# Patient Record
Sex: Female | Born: 1965 | Race: White | Hispanic: No
Health system: Southern US, Academic
[De-identification: ages and names within clinical notes are randomized; demographics above are authoritative.]

## PROBLEM LIST (undated history)

## (undated) DIAGNOSIS — I10 Essential (primary) hypertension: Secondary | ICD-10-CM

## (undated) DIAGNOSIS — E119 Type 2 diabetes mellitus without complications: Secondary | ICD-10-CM

## (undated) DIAGNOSIS — M1990 Unspecified osteoarthritis, unspecified site: Secondary | ICD-10-CM

## (undated) DIAGNOSIS — K219 Gastro-esophageal reflux disease without esophagitis: Secondary | ICD-10-CM

## (undated) DIAGNOSIS — F32A Depression, unspecified: Secondary | ICD-10-CM

## (undated) DIAGNOSIS — G4700 Insomnia, unspecified: Secondary | ICD-10-CM

## (undated) DIAGNOSIS — G629 Polyneuropathy, unspecified: Secondary | ICD-10-CM

## (undated) DIAGNOSIS — F419 Anxiety disorder, unspecified: Secondary | ICD-10-CM

## (undated) DIAGNOSIS — G2581 Restless legs syndrome: Secondary | ICD-10-CM

## (undated) DIAGNOSIS — N200 Calculus of kidney: Secondary | ICD-10-CM

## (undated) HISTORY — DX: Calculus of kidney: N20.0

## (undated) HISTORY — DX: Unspecified osteoarthritis, unspecified site: M19.90

## (undated) HISTORY — PX: HX GALL BLADDER SURGERY/CHOLE: SHX55

## (undated) HISTORY — DX: Depression, unspecified: F32.A

## (undated) HISTORY — DX: Polyneuropathy, unspecified: G62.9

## (undated) HISTORY — DX: Type 2 diabetes mellitus without complications: E11.9

## (undated) HISTORY — DX: Restless legs syndrome: G25.81

## (undated) HISTORY — DX: Insomnia, unspecified: G47.00

## (undated) HISTORY — DX: Essential (primary) hypertension: I10

## (undated) HISTORY — PX: HX TUBAL LIGATION: SHX77

## (undated) HISTORY — DX: Anxiety disorder, unspecified: F41.9

## (undated) HISTORY — DX: Gastro-esophageal reflux disease without esophagitis: K21.9

---

## 1990-07-04 ENCOUNTER — Other Ambulatory Visit (HOSPITAL_COMMUNITY): Payer: Self-pay

## 2017-04-22 IMAGING — MG 3D SCREENING MAMMO BIL W/CAD
5 series · 7 of 24 positions shown · non-contrast
Comparison: 04/21/2017.

------------- REPORT [id number] -------------
PATIENT REGISTRATION FORM

Scheduled Modality:
MG
Date Scheduled:
Referring Physician: 
PATIENT INFORMATION
 Mr.
 Mrs.
 Miss  Ms.
Email address:
[place]ECK[place], TYQUAN
Social Security:  
Age:
Sex:
F
Mailing Address:
 [address]
Home Phone
Cell Phone 
Study Type:
Diagnosis / Symptoms:
Insurance Authorization:
[employer], [employer], [place], FAXING ORDER
Notes/Special Instructions: 
INSURANCE INFORMATION
(Please give your insurance card to the receptionist.)
Name of  primary insurance
[employer]
Subscriber’s S.S. #
Group #
Insurance ID # 
Co-payment:
$
Patient’s relationship to subscriber:
 Self
 Spouse
 Child
 Other
Name of secondary insurance (if applicable):
Insurance ID #
IN CASE OF EMERGENCY
Name of friend or relative to call in case of emergency:
Relationship to patient:
Home phone #
Work phone #
The above information is true to the best of my knowledge. I authorize my insurance benefits be paid directly to the physician. I understand that I am financially responsible for any balance. I also authorize [employer] or insurance company to release any information required to process my claims.
Patient/Guardian signature
Date
------------- REPORT [id number] -------------
[facility] [place]
[address]
Daina Ms.TIGER, RAZE:
We wish to report the following on your recent mammography examination. We are sending a report to your referring physician or other health care provider. 
(       Normal/Negative:
No evidence of cancer.
This statement is mandated by the [place], Department of Health.
Your examination was performed by one of our technologists, who are registered radiological technologists and also specially certified in mammography:
___
Parlak, Edaly (M)
Dang, Mcalex (M)
Your mammogram was interpreted by our radiologist.
( 
Sofeine Made, M.D.
(Annual Breast Examination by a physician or other health care provider
(Annual Mammography Screening beginning at age 40
(Monthly Breast Self Examination
------------- REPORT [id number] -------------
AWWAD, JEMMA
HENG FAI LENE,NP
EXAM:  3D BILATERAL ANNUAL SCREENING DIGITAL MAMMOGRAM WITH TOMOSYNTHESIS AND CAD
INDICATION: Screening.

[Series 2658: R CC · right · 0.10mm/px · 2 of 2 slices shown]
[im 1/2]
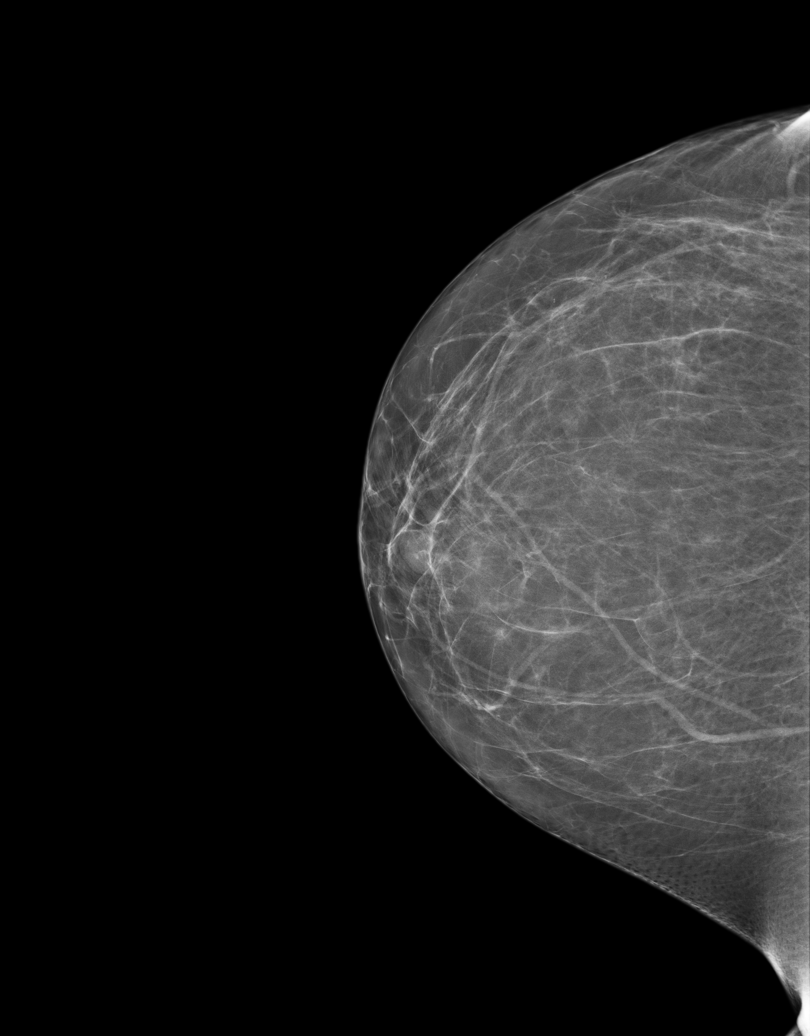
[im 2/2]
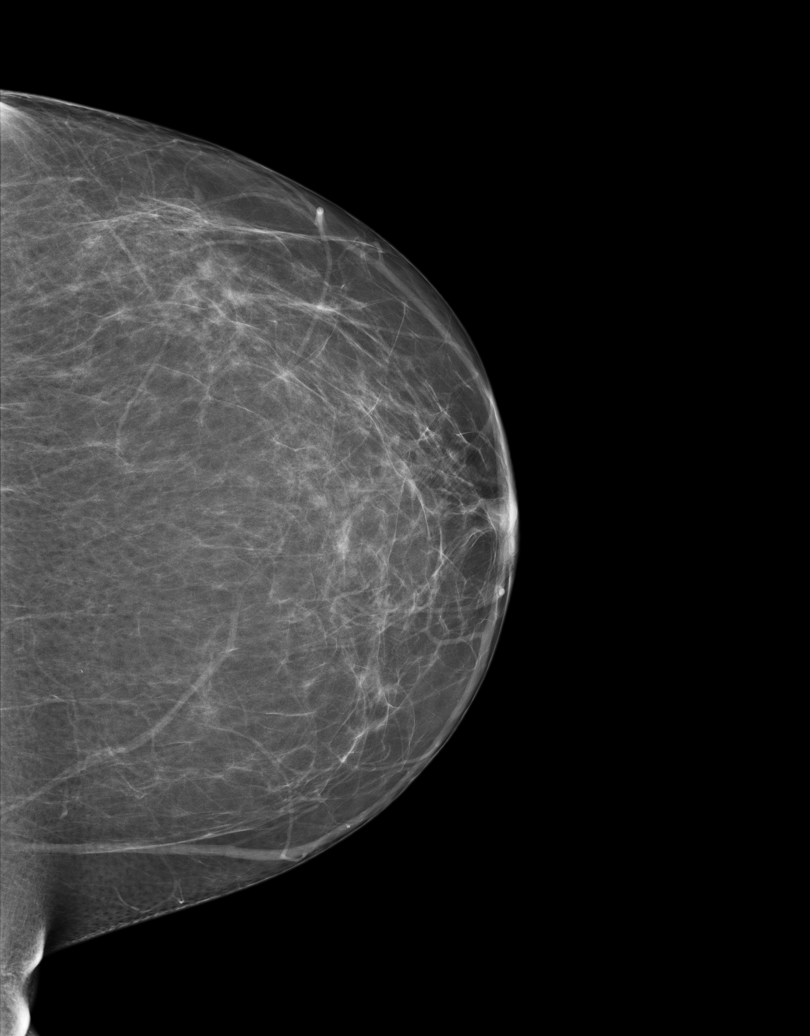

[Series 2660: 3D SCREENING MAMMO BIL W/CAD · 2 acquisitions, 2 frames shown (1 of 2)]
[im 1/2]
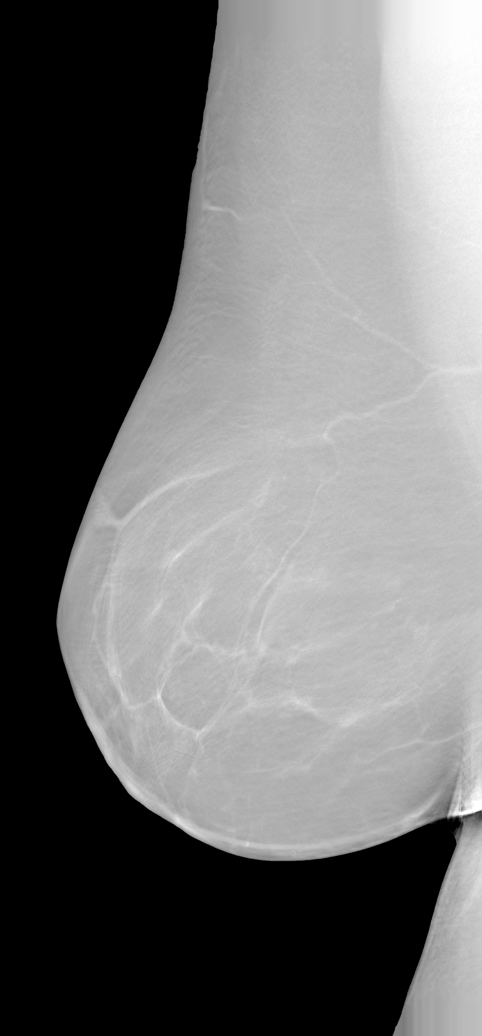
[im 2/2]
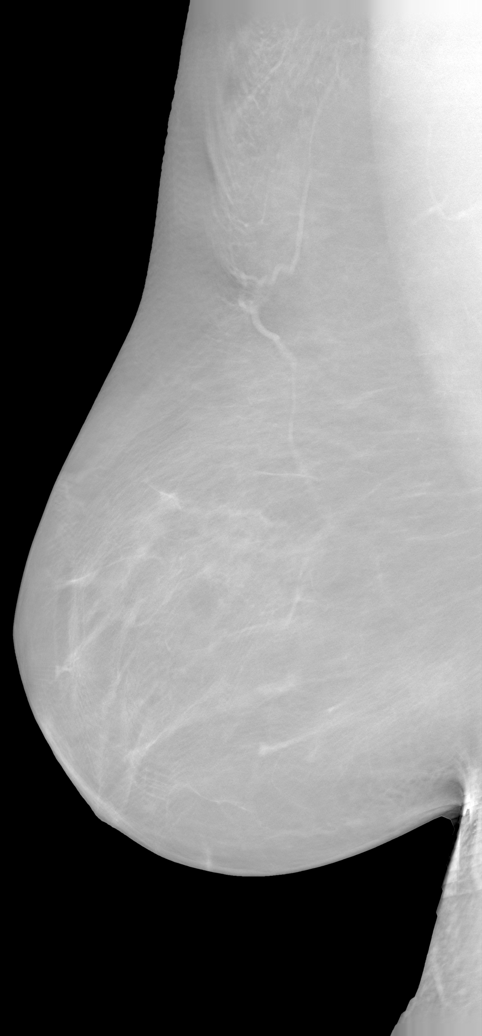

[3D SCREENING MAMMO BIL W/CAD (2 of 2) · tomo slice 16/99.0]
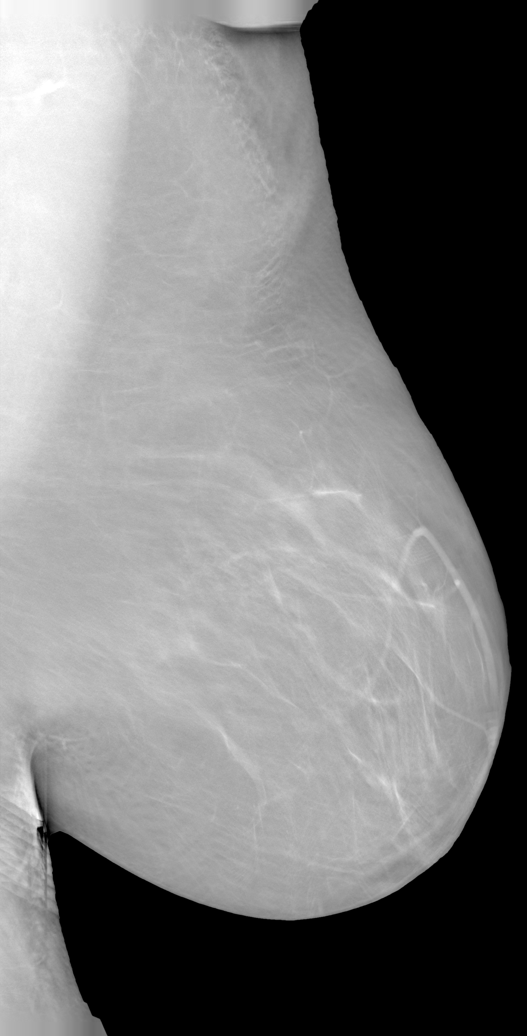

[R]
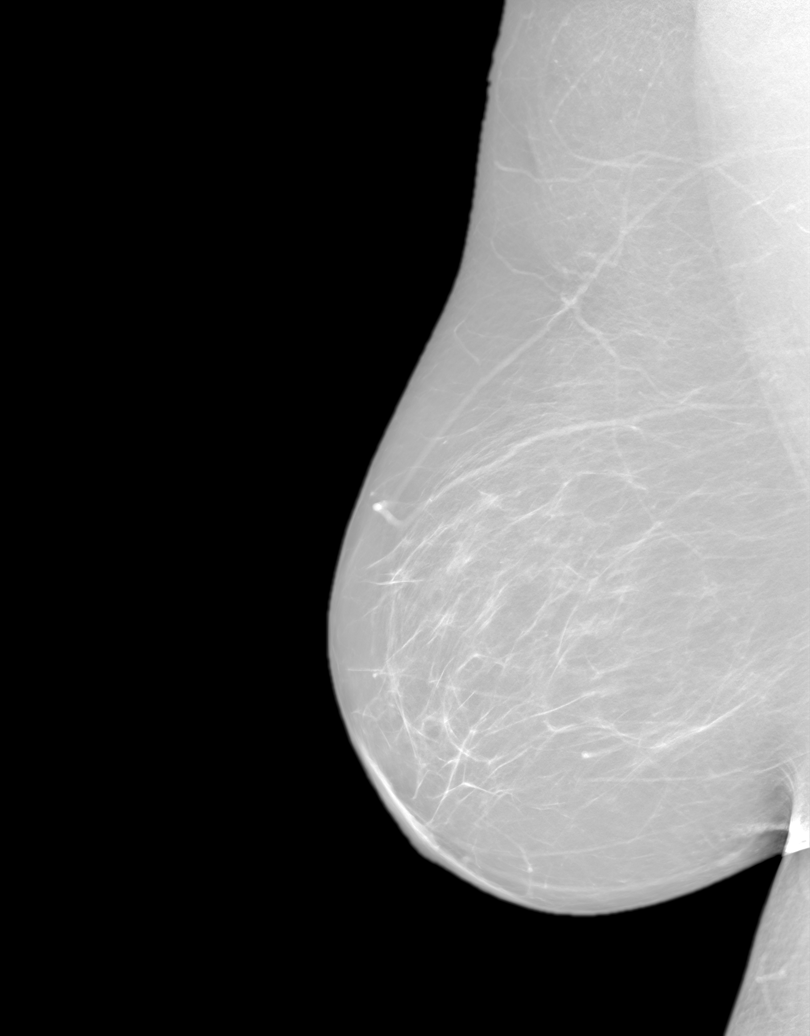

[L]
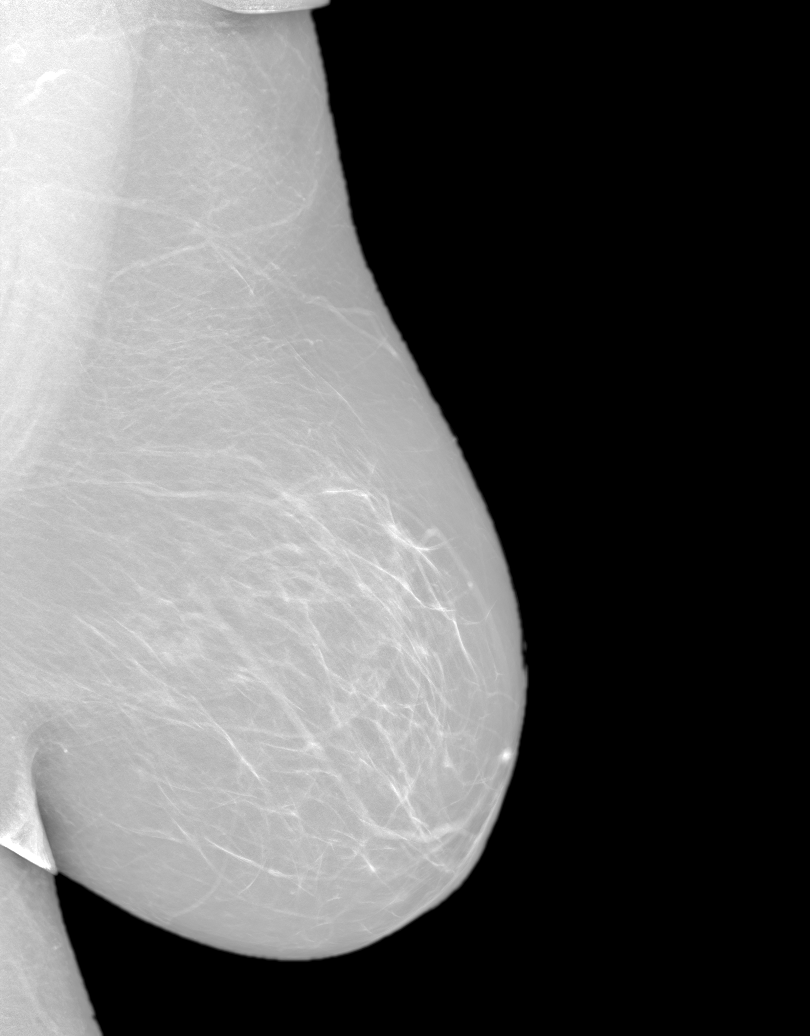

[7 of 24 positions shown; findings below may reference images not displayed]

FINDINGS: There are scattered fibroglandular elements.  There is no mass or suspicious cluster of microcalcifications.   There is no architectural distortion, skin thickening or nipple retraction.
IMPRESSION: 1.  BIRADS 2-Benign findings. Patient has been added in a reminder system with a target date for the next screening mammography.

2.  DENSITY CODE –  B (Scattered areas of fibroglandular density). 

Final Assessment Code:

Bi-Rads 2 

BI-RADS 0
Need additional imaging evaluation

BI-RADS 1
Negative mammogram

BI-RADS 2
Benign finding

BI-RADS 3
Probably benign finding: short-interval follow-up suggested

BI-RADS 4
Suspicious abnormality:  biopsy should be considered

BI-RADS 5
Highly suggestive of malignancy; appropriate action should be taken

BI-RADS 6
Known Biopsy-proven Malignancy – Appropriate action should be taken

NOTE:
In compliance with Federal regulations, the results of this mammogram are being sent to the patient.

## 2021-03-25 IMAGING — MR MRI LUMBAR SPINE WITHOUT CONTRAST
6 series · 48 of 48 positions shown · IV contrast (gadolinium)
Comparison: None available.

﻿EXAM:  85391   MRI LUMBAR SPINE WITHOUT CONTRAST
INDICATION: Chronic lower back pain.
TECHNIQUE: Multiplanar multisequential MRI of the lumbar spine was performed without gadolinium contrast.  A non conventional body coil was utilized due to patient's extremely large body habitus.

[Series 7: T2 · sagittal · 5.0mm · 1.00mm/px · 6 of 13 slices shown (1 of 3)]
[im 1/13]
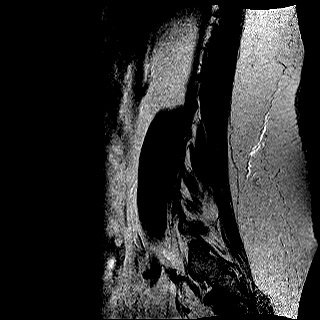
[im 3/13]
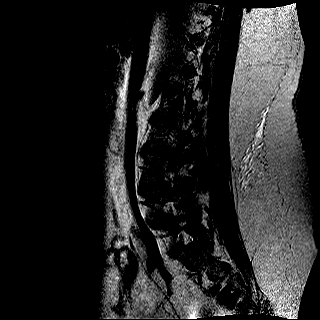
[im 5/13]
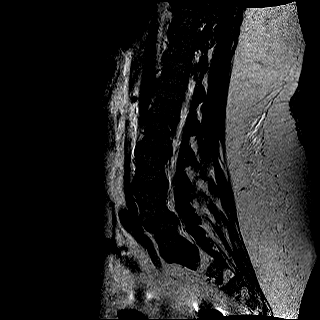
[im 8/13]
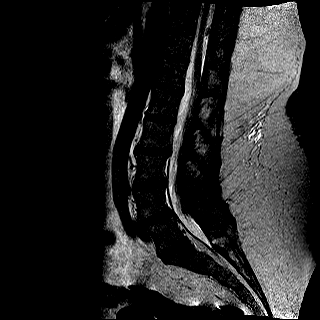
[im 10/13]
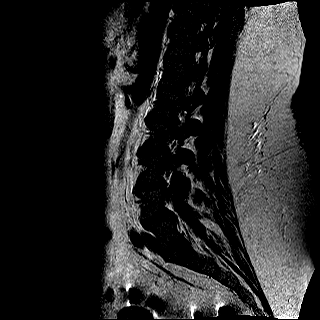
[im 13/13]
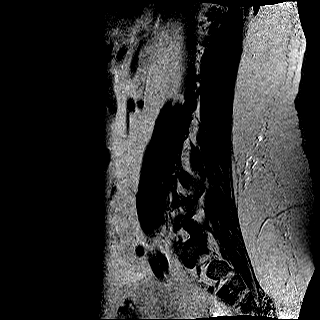

[Series 8: T1 · sagittal · 5.0mm · 1.00mm/px · 5 of 13 slices shown (1 of 2)]
[im 1/13]
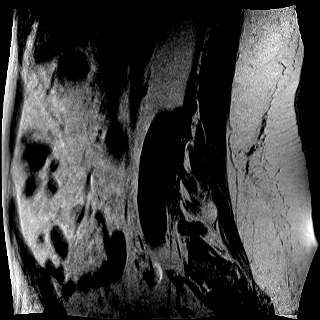
[im 4/13]
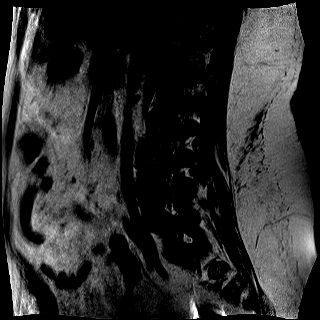
[im 7/13]
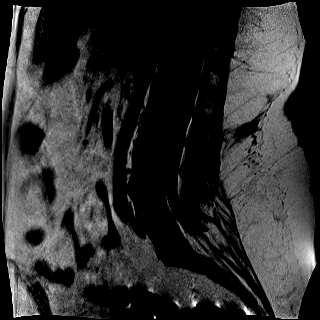
[im 10/13]
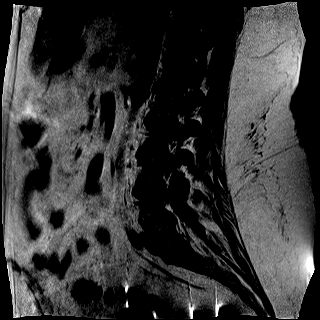
[im 13/13]
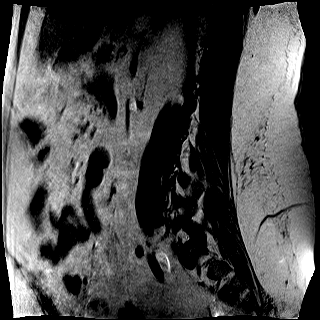

[Series 9: STIR · sagittal · 5.0mm · 1.25mm/px · 5 of 13 slices shown]
[im 1/13]
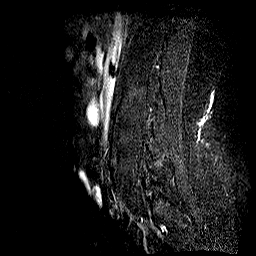
[im 4/13]
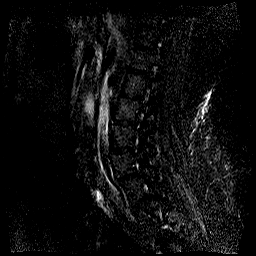
[im 7/13]
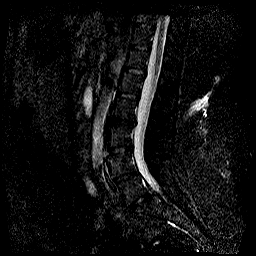
[im 10/13]
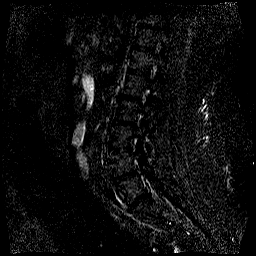
[im 13/13]
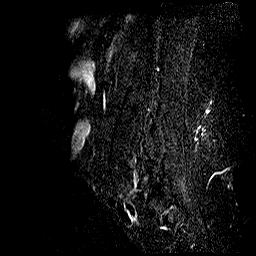

[Series 10: T2 · axial · 5.0mm · 0.89mm/px · z∈[-144,+97]mm · 12 of 30 slices shown (2 of 3)]
[im 1/30]
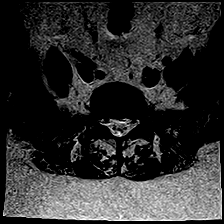
[im 3/30]
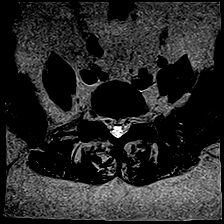
[im 6/30]
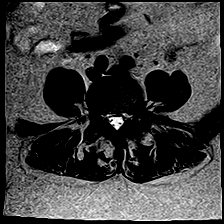
[im 8/30]
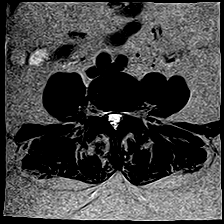
[im 11/30]
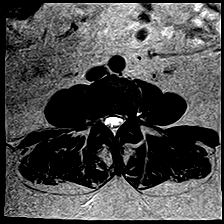
[im 14/30]
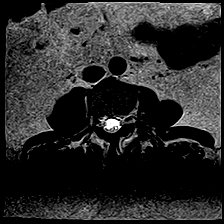
[im 16/30]
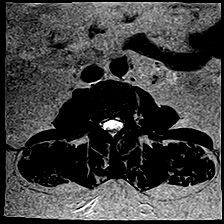
[im 19/30]
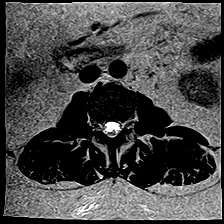
[im 22/30]
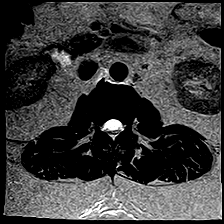
[im 24/30]
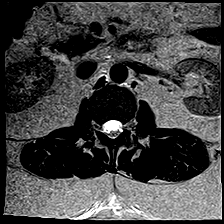
[im 27/30]
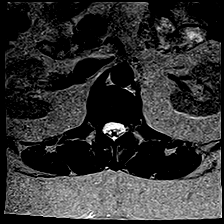
[im 30/30]
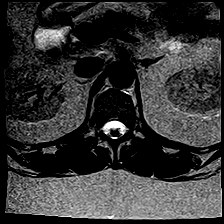

[Series 11: T1 · axial · 5.0mm · 0.89mm/px · z∈[-144,+97]mm · 12 of 30 slices shown (2 of 2)]
[im 1/30]
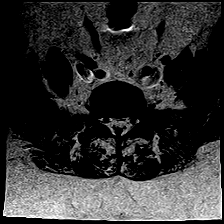
[im 3/30]
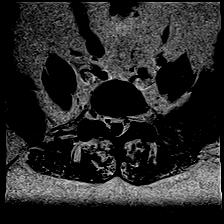
[im 6/30]
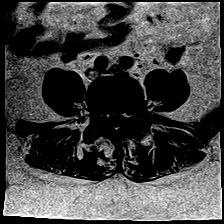
[im 8/30]
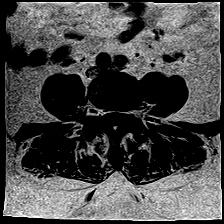
[im 11/30]
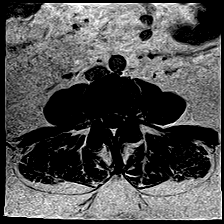
[im 14/30]
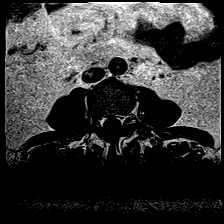
[im 16/30]
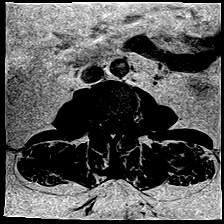
[im 19/30]
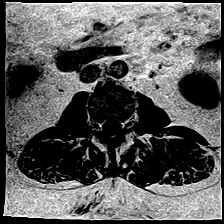
[im 22/30]
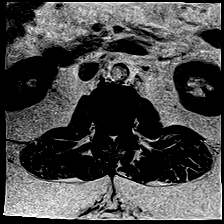
[im 24/30]
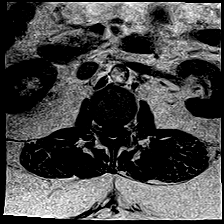
[im 27/30]
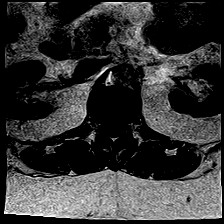
[im 30/30]
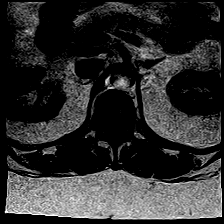

[Series 12: T2 · coronal · 4.0mm · 1.34mm/px · 8 of 20 slices shown (3 of 3)]
[im 1/20]
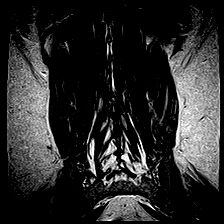
[im 3/20]
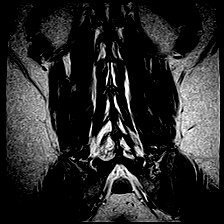
[im 6/20]
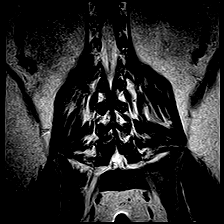
[im 9/20]
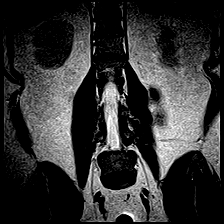
[im 11/20]
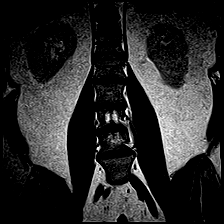
[im 14/20]
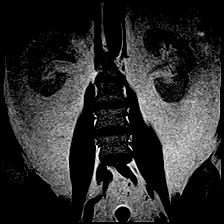
[im 17/20]
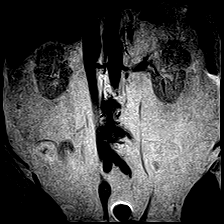
[im 20/20]
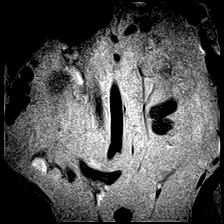

[48 of 48 positions shown; findings below may reference images not displayed]

FINDINGS: Bone marrow signal intensity is normal. There is no acute fracture or subluxation. Distal spinal cord is normal in signal intensity and terminates normally at T12-L1 disc space level. Spinal canal is congenitally narrow. 

At T12-L1 level, there is a small broad-based central disc bulge, mildly effacing the ventral thecal sac.  There is no significant neural foraminal stenosis. 

At L1-2 level, there is a minimal bulging annulus, minimally effacing the ventral thecal sac. There is no significant neural foraminal stenosis. 

At L2-3 level, there is a small broad-based central disc bulge, mildly effacing the ventral thecal sac. There is mild bilateral neural foraminal stenosis from facet arthropathy and bulging annulus without nerve root impingement. 

At L3-4 level, there is minimal retrolisthesis of L3 on L4 vertebral body.  There is also a small broad-based central disc bulge, mildly effacing the ventral thecal sac. There is mild-to-moderate right and mild left neural foraminal stenosis from facet arthropathy and bulging annulus. 

At L4-5 level, there is minimal anterolisthesis of L4 on L5 vertebral body.  There is a small broad-based central disc bulge, mildly effacing the ventral thecal sac. There is mild bilateral neural foraminal stenosis from facet arthropathy without nerve root impingement. 

At L5-S1 level, there is mild right neural foraminal stenosis from facet arthropathy. 

Paraspinal soft tissues are unremarkable.
IMPRESSION: 1. Minimal retrolisthesis of L3 on L4 vertebral body and minimal anterolisthesis of L4 on L5 vertebral body.  

2. No significant disc herniation or spinal stenosis at any level. 

3. Multilevel neural foraminal stenosis as detailed above.

## 2021-03-25 IMAGING — MR MRI CERVICAL SPINE WITHOUT CONTRAST
4 of 5 series · 24 of 48 positions shown · IV contrast (gadolinium)
Comparison: None available.

﻿EXAM:  [id number]   MRI CERVICAL SPINE WITHOUT CONTRAST
INDICATION: Chronic neck pain.
TECHNIQUE: Multiplanar multisequential MRI of the cervical spine was performed without gadolinium contrast.

[Series 5: T2 · sagittal · 3.0mm · 0.75mm/px · 8 of 15 slices shown (1 of 2)]
[im 1/15]
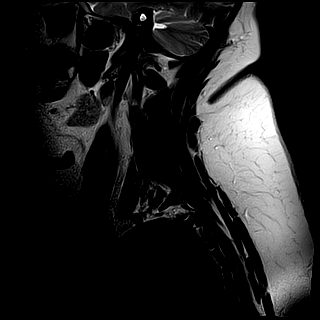
[im 3/15]
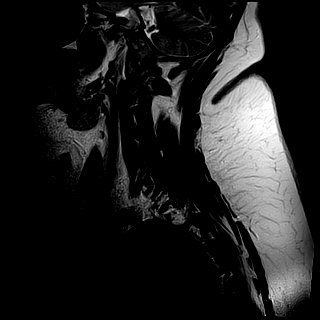
[im 5/15]
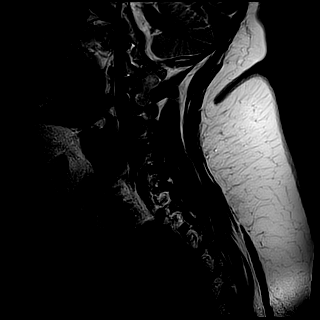
[im 7/15]
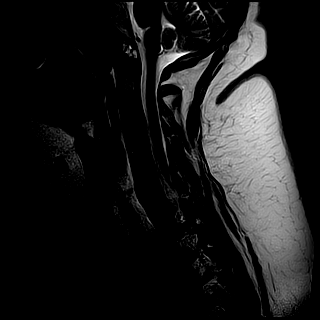
[im 9/15]
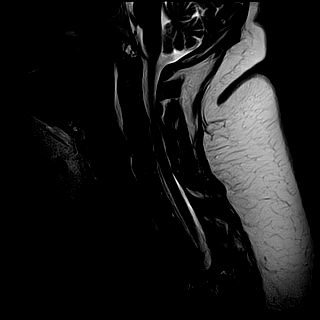
[im 11/15]
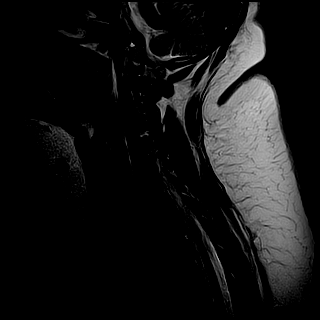
[im 13/15]
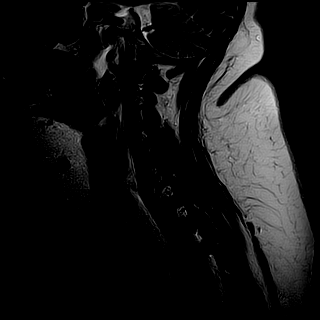
[im 15/15]
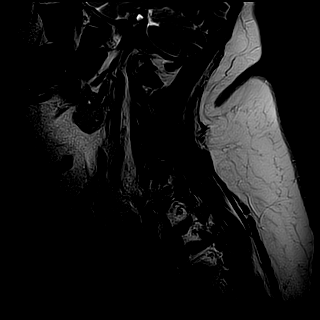

[Series 6: T1 · sagittal · 3.0mm · 0.47mm/px · 3 of 15 slices shown]
[im 2/15]
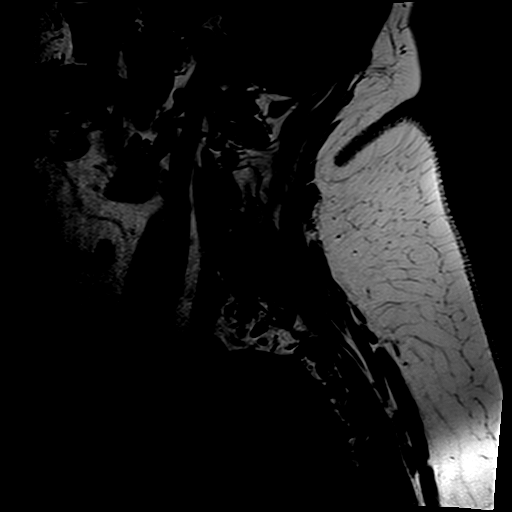
[im 8/15]
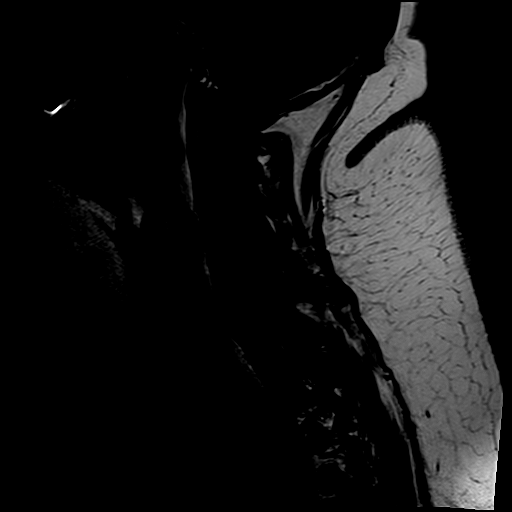
[im 13/15]
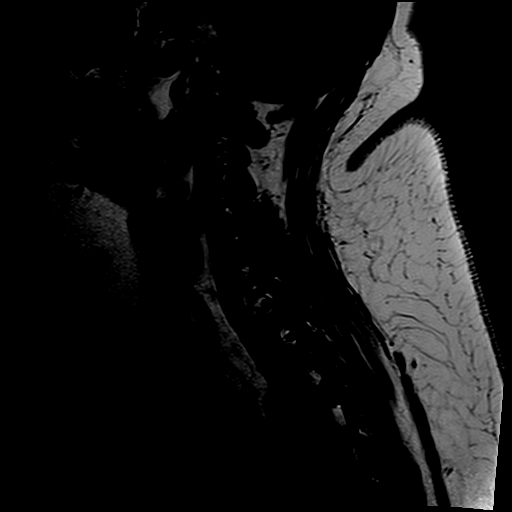

[Series 7: STIR · sagittal · 3.0mm · 0.47mm/px · 3 of 15 slices shown]
[im 2/15]
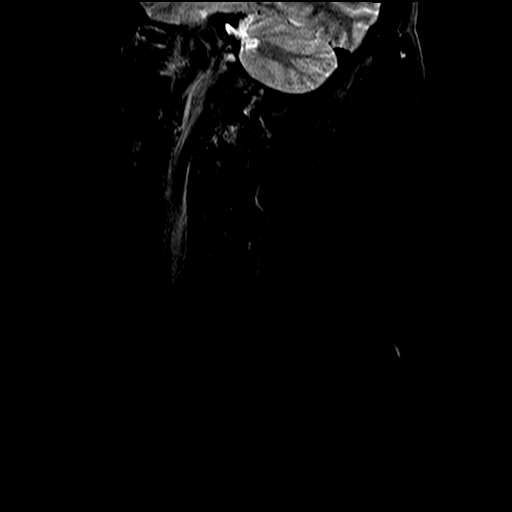
[im 8/15]
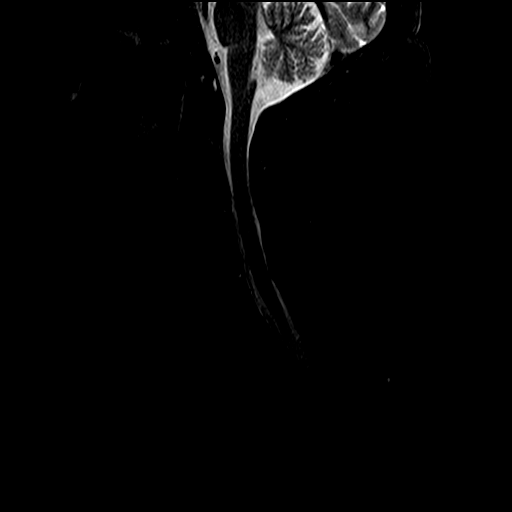
[im 13/15]
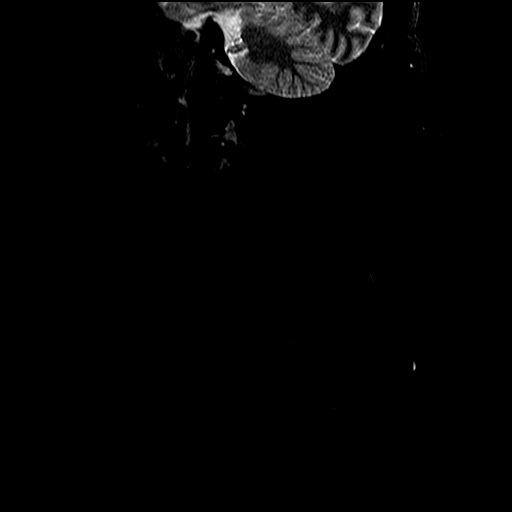

[Series 8: T2 · axial · 3.0mm · 0.39mm/px · z∈[-97,-1]mm · 10 of 18 slices shown (2 of 2)]
[im 1/18]
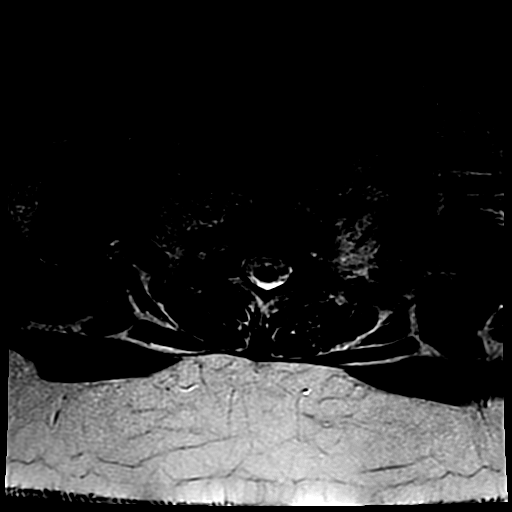
[im 2/18]
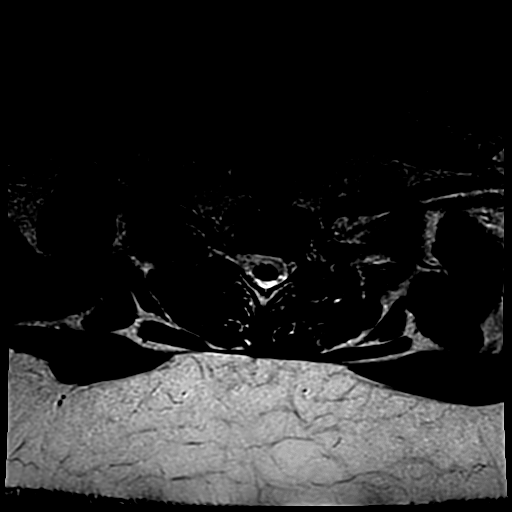
[im 4/18]
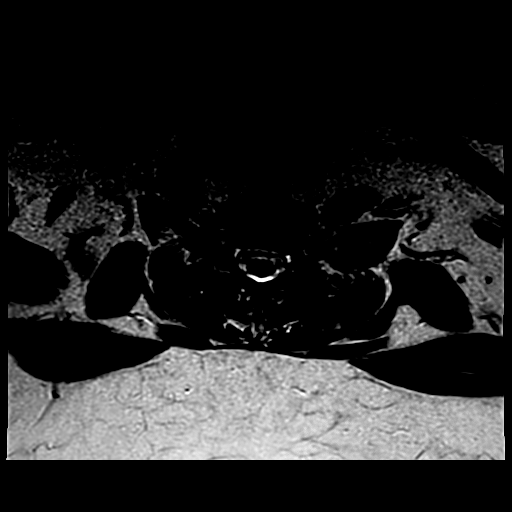
[im 6/18]
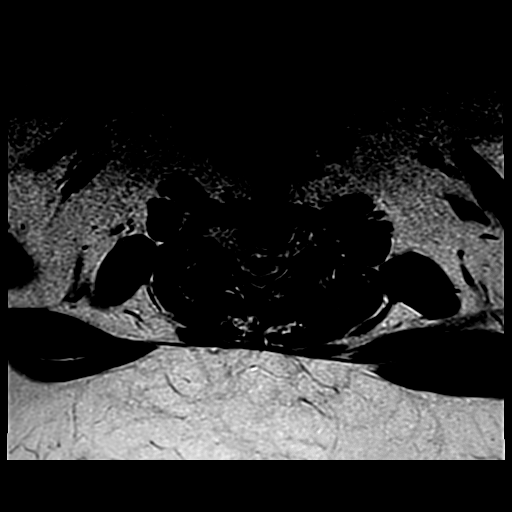
[im 7/18]
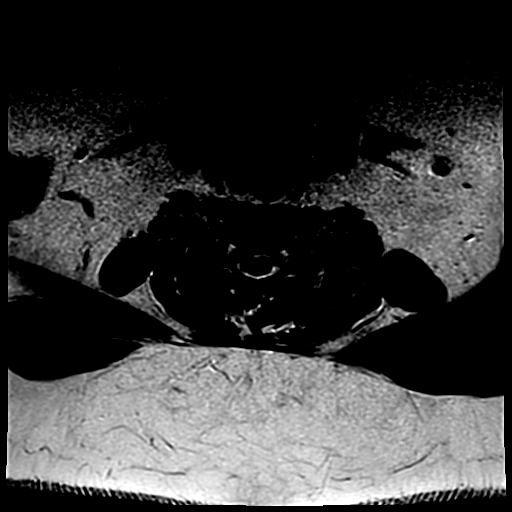
[im 9/18]
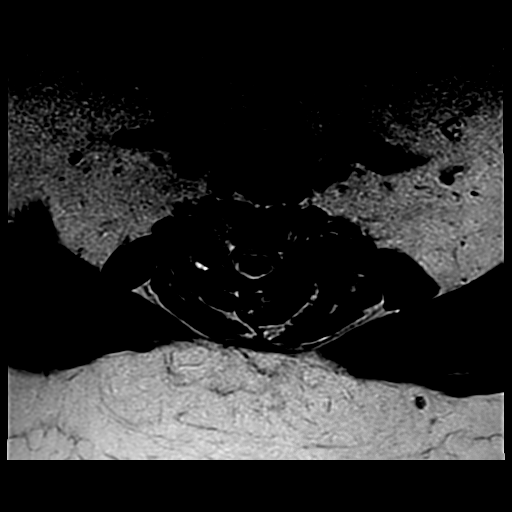
[im 11/18]
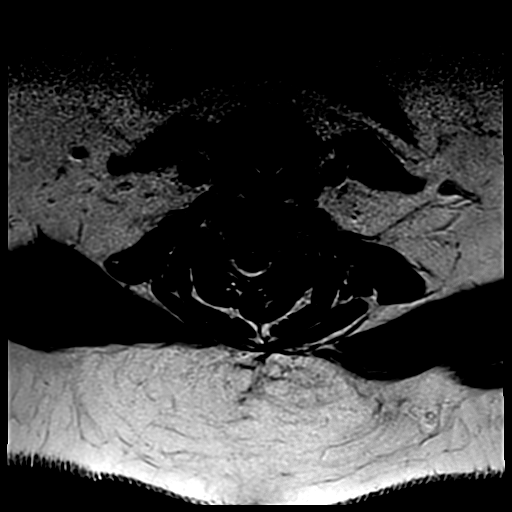
[im 12/18]
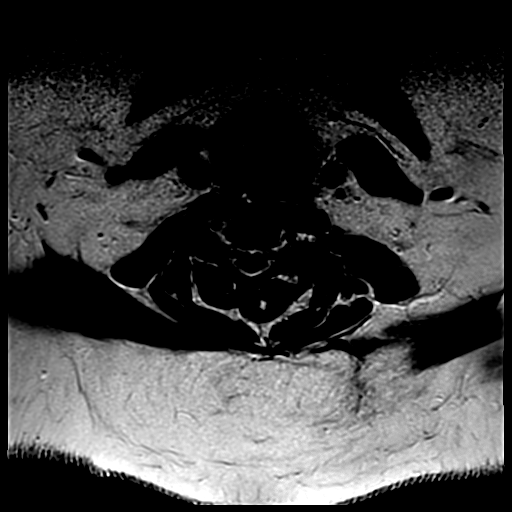
[im 14/18]
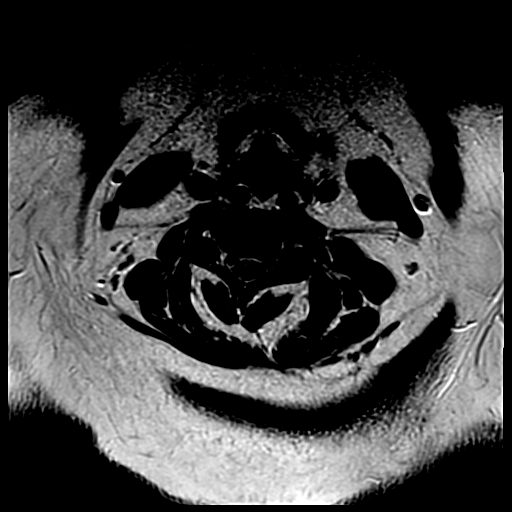
[im 16/18]
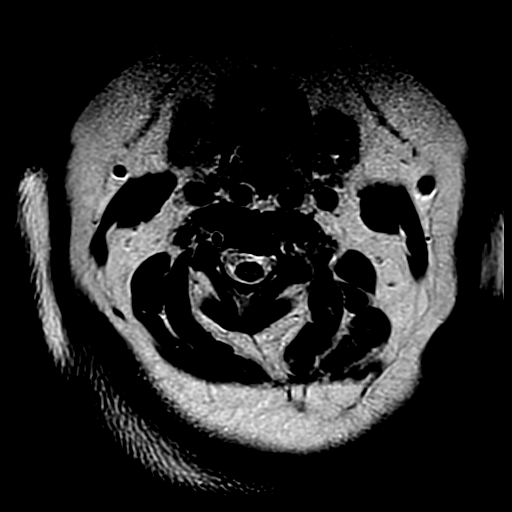

[24 of 48 positions shown; findings below may reference images not displayed]

FINDINGS: Vertebral bodies are normal in height, alignment and signal intensity. There is no acute fracture or subluxation. Visualized spinal cord is normal in signal intensity without evidence of compression at any level. 

C2-3 level is unremarkable. 

At C3-4 level, there is a small broad-based central disc bulge, mildly effacing the ventral CSF. There is moderate to severe right and mild left neural foraminal stenosis from facet and uncovertebral joint hypertrophy. 

C4-5 level is unremarkable. 

At C5-6 level, there is a minimal bulging annulus, minimally effacing the ventral CSF. There is no significant neural foraminal stenosis. 

C6-7, C7-T1 level and paraspinal soft tissues are unremarkable.
IMPRESSION: Mild degenerative changes as detailed above.

## 2021-04-08 IMAGING — CT CT ABDOMEN W/O & W/ CONTRAST
2 of 6 series · 12 of 46 positions shown, 19 images · IV contrast (CONTRAST)
Comparison: None available.

﻿EXAM:  CT ABDOMEN W/O & W/ CONTRAST
INDICATION: Upper abdominal pain.
TECHNIQUE: Axial CT imaging of the abdomen was performed without and with 80 mL of Optiray 350.  Oral contrast was also administered. Images were reviewed in multiple windows and projections. Exam was performed using 1 or more of the following dose reduction techniques: Automated exposure control, adjustment of the mA and/or kV according to patient size, or the use of iterative reconstruction technique.

[pre · axial · non-contrast · 0.95mm/px · z∈[-1025,-722]mm · 9 of 127 slices shown, 15 images]
[im 13/127  soft-tissue]
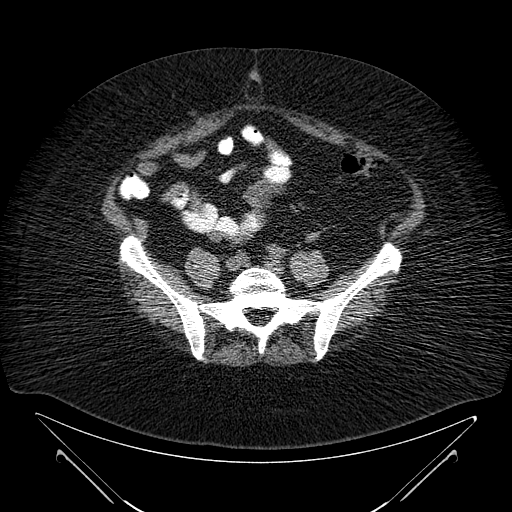
[im 13/127  bone]
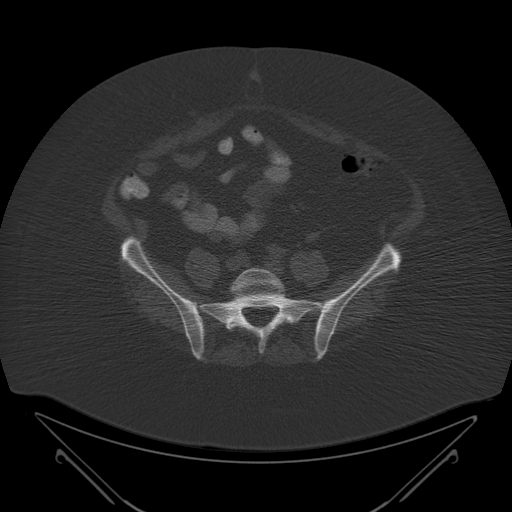
[im 26/127  soft-tissue]
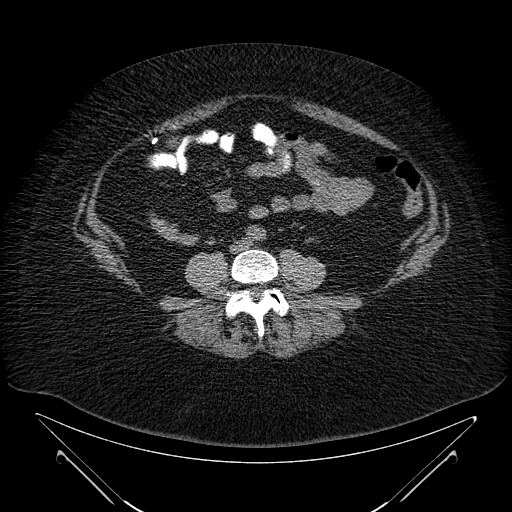
[im 38/127  soft-tissue]
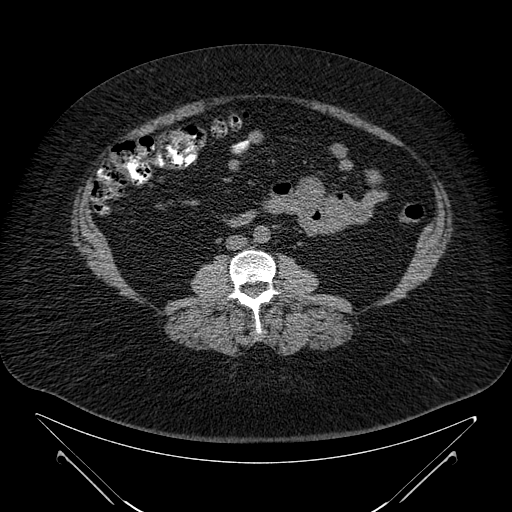
[im 51/127  soft-tissue]
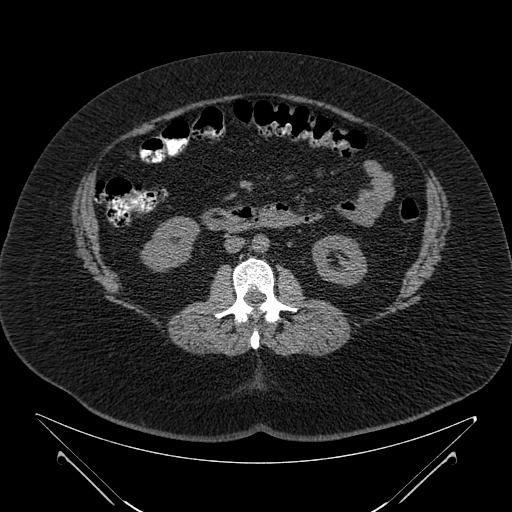
[im 64/127  soft-tissue]
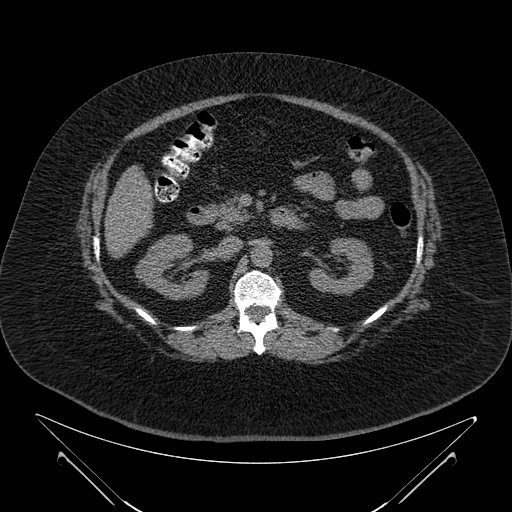
[im 76/127  soft-tissue]
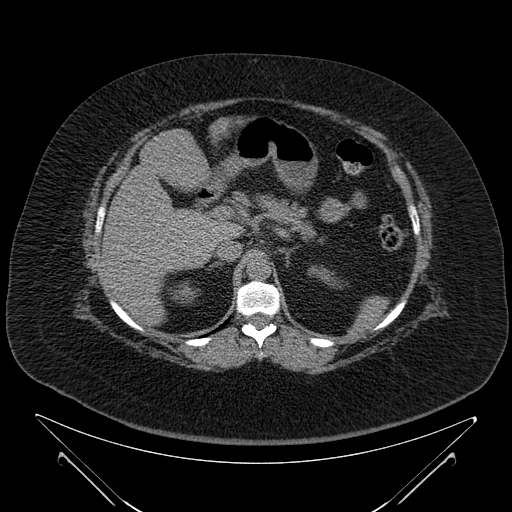
[im 76/127  lung]
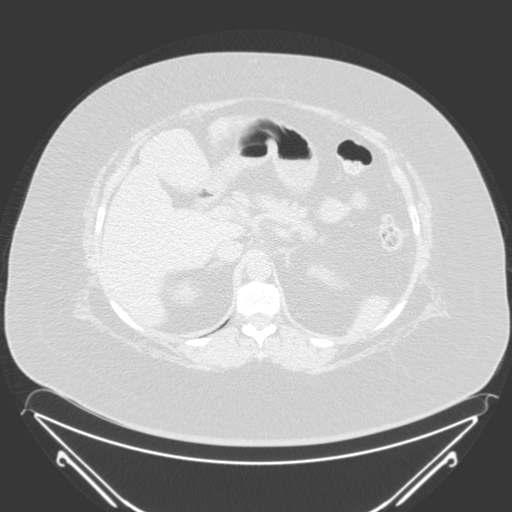
[im 89/127  soft-tissue]
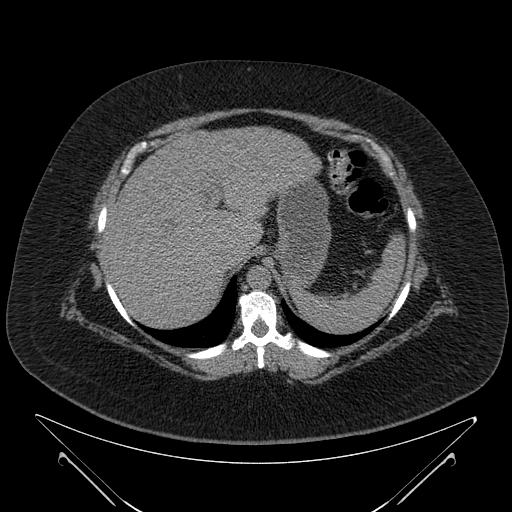
[im 89/127  lung]
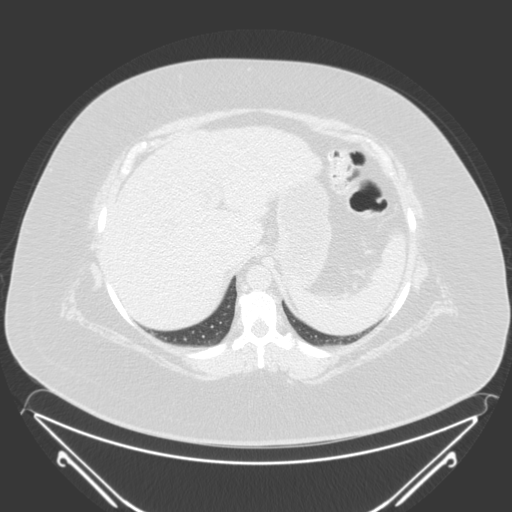
[im 101/127  soft-tissue]
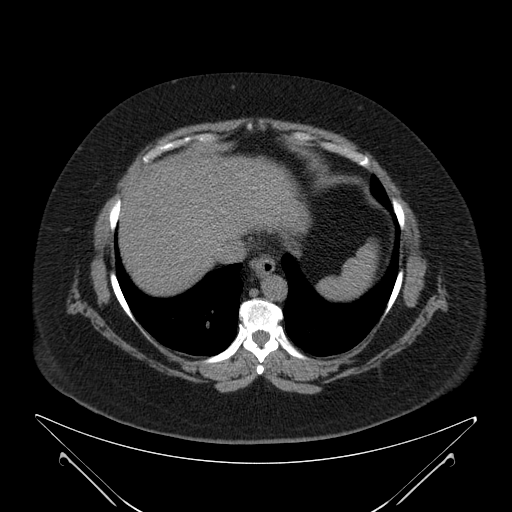
[im 101/127  lung]
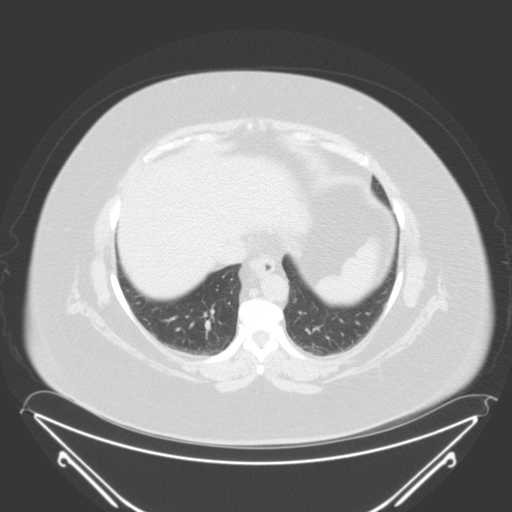
[im 114/127  soft-tissue]
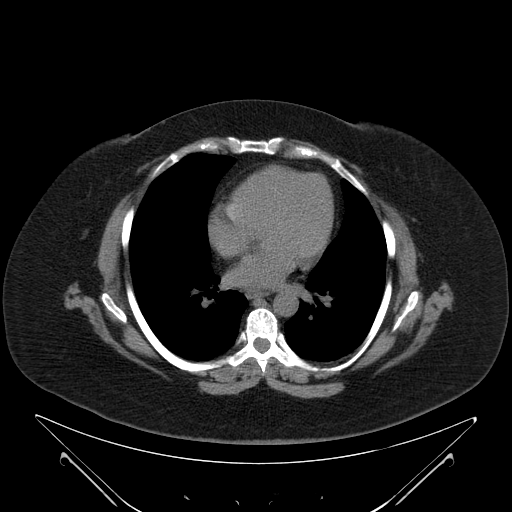
[im 114/127  lung]
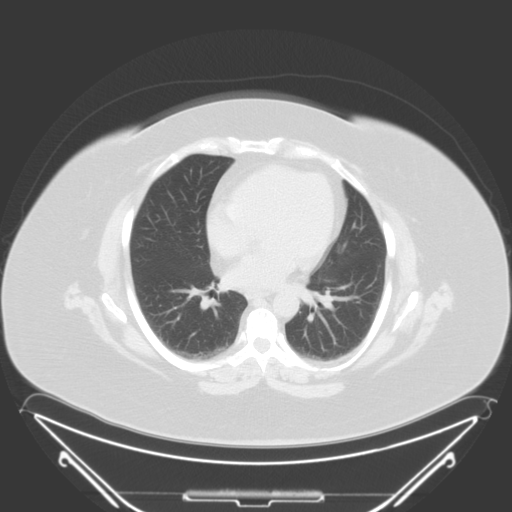
[im 114/127  bone]
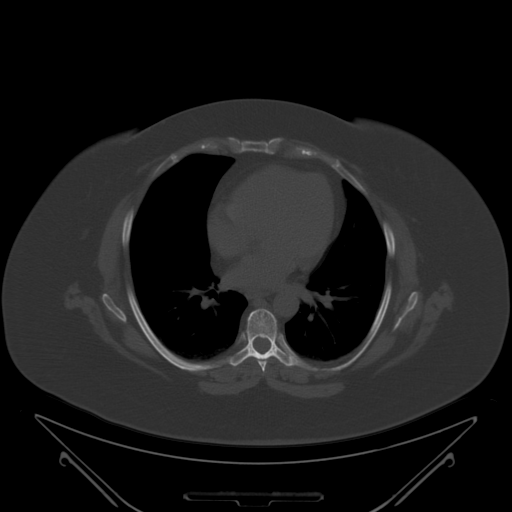

[cor · coronal · 0.94mm/px · 3 of 86 slices shown, 4 images]
[im 29/86  soft-tissue]
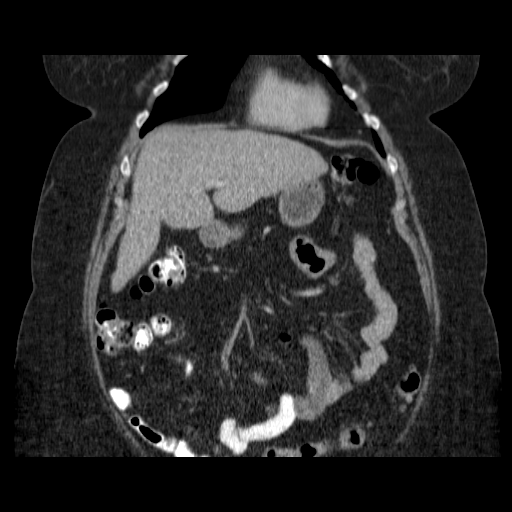
[im 38/86  soft-tissue]
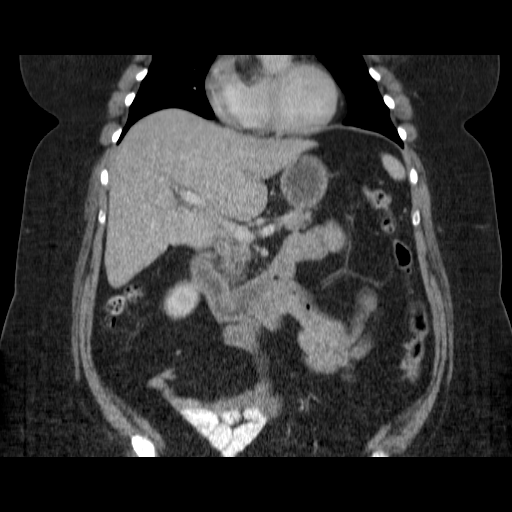
[im 38/86  bone]
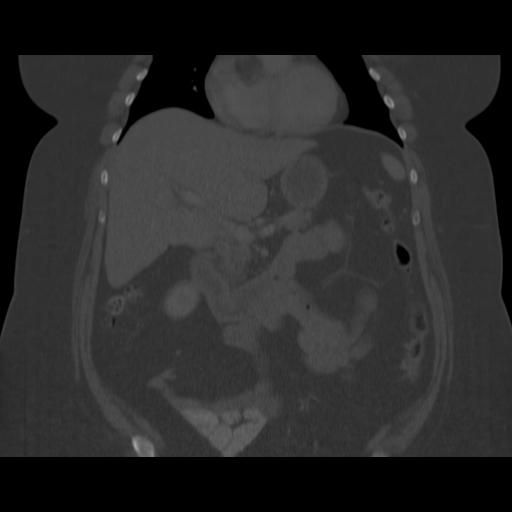
[im 48/86  soft-tissue]
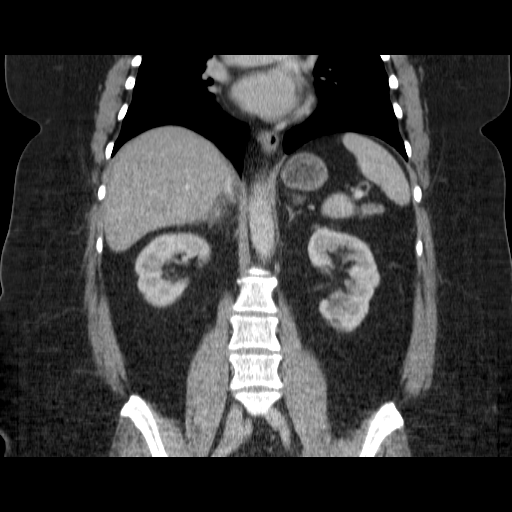

[12 of 46 positions shown; findings below may reference images not displayed]

FINDINGS: Limited visualization of lung bases is unremarkable.  There is no pleural or pericardial effusion.

Micronodular hepatic contour is suggestive of underlying cirrhotic changes. Gallbladder is surgically absent. There are small nonobstructing bilateral renal calculi.  Spleen, pancreas and adrenal glands are normal. 

Visualized bowel loops are normal in course and caliber, there is no obstruction or free air.  There is a 2.6 cm gastric fundus diverticulum.  There is no ascites or adenopathy. There are minimal vascular calcifications.  Minimal anterolisthesis of L4 on L5 vertebral body is likely degenerative.
IMPRESSION: 1. No acute abnormality. 

2. Cirrhotic liver. 

3. Small nonobstructing bilateral renal calculi.

## 2022-02-16 ENCOUNTER — Emergency Department
Admission: EM | Admit: 2022-02-16 | Discharge: 2022-02-16 | Discharge status: Against Medical Advice | Payer: Medicare Other | Attending: Family Medicine | Admitting: Family Medicine

## 2022-02-16 ENCOUNTER — Emergency Department (HOSPITAL_COMMUNITY): Payer: Medicare Other

## 2022-02-16 ENCOUNTER — Other Ambulatory Visit: Payer: Self-pay

## 2022-02-16 DIAGNOSIS — Z5321 Procedure and treatment not carried out due to patient leaving prior to being seen by health care provider: Secondary | ICD-10-CM | POA: Insufficient documentation

## 2022-02-16 MED ORDER — ASPIRIN 81 MG CHEWABLE TABLET
CHEWABLE_TABLET | ORAL | Status: AC
Start: 2022-02-16 — End: 2022-02-16
  Filled 2022-02-16: qty 4

## 2022-02-16 MED ORDER — ASPIRIN 81 MG CHEWABLE TABLET
324.0000 mg | CHEWABLE_TABLET | ORAL | Status: AC
Start: 2022-02-16 — End: 2022-02-16
  Administered 2022-02-16: 162 mg via ORAL

## 2022-02-16 NOTE — ED Nurses Note (Signed)
Pt informed ED staff, "that she was not being listened to. That she was still having chest pain and nothing was being done." Pt informed ED staff that she would be leaving at this time.

## 2022-02-16 NOTE — ED Attending Handoff Note (Signed)
[facility]  Emergency Department  Provider in Triage Note    Name: Caroline Hooper  Age: 57 y.o.  Gender: female     Subjective:   MARNISHA Hooper is a 57 y.o. female who presents with complaint of Chest Pain  and Headache  .  PATIENT PRESENTED TO THE EMERGENCY DEPARTMENT WITH COMPLAINTS OF INTERMITTENT EPISODES OF CHEST PAIN WITH SHORTNESS A BREATH AND NAUSEA.  SYMPTOMS HAVE BEEN ONGOING FOR A VERY LONG TIME, BUT SEEM TO HAVE WORSENED TODAY.  PATIENT WAS SEEN BY PCP LAST WEEK AND INSTRUCTED TO PRESENT TO THE EMERGENCY DEPARTMENT FOR IMMEDIATE EVALUATION, BUT PATIENT DID NOT WANT TO COME UNTIL TODAY.  PATIENT COMPLAINS OF A SHARP PAIN IN THE LEFT ANTERIOR CHEST THAT DOES RADIATE INTO THE NECK AND LEFT UPPER EXTREMITY.  PATIENT ALSO COMPLAINS OF HYPERTENSION AND HEADACHE.  NOTHING REALLY SEEMS TO MAKE HER SYMPTOMS BETTER.  PATIENT DENIES ANY FURTHER COMPLAINTS AT TIME OF EXAMINATION.    Objective:   Filed Vitals:    02/16/22 1800   BP: (!) 138/90   Pulse: 94   Resp: 18   Temp: 36.6 C (97.9 F)   SpO2: 97%      Focused Physical Exam shows PHYSICAL EXAMINATION WAS MOSTLY UNREMARKABLE.  HEART AND LUNG EXAM WITHIN NORMAL LIMITS.  THERE WAS NO SIGNIFICANT TENDERNESS TO PALPATION OF THE ANTERIOR CHEST WALL.  PATIENT WAS RESTING COMFORTABLY.  NO SIGNIFICANT SWELLING OF THE LOWER EXTREMITIES.    Assessment:  A medical screening exam was completed.  This patient is a 57 y.o. female with initial findings showing PATIENT COMPLAINING OF INTERMITTENT EPISODES OF CHEST PAIN OR SHORTNESS A BREATH.  SYMPTOMS HAVE BEEN ONGOING FOR SOME TIME.  PATIENT WAS SEEN BY PCP LAST WEEK FOR IDENTICAL SYMPTOMS AND INSTRUCTED TO PRESENT TO THE EMERGENCY DEPARTMENT FOR IMMEDIATE EVALUATION, BUT PATIENT DID NOT PRESENT AT THAT TIME.  SHE STATES THAT PAIN WORSENED TODAY AND SHE GOT SCARED.  PATIENT ADMITS TO A STRONG FAMILY HISTORY OF CARDIAC ABNORMALITIES.  SHE ALSO REPORTS THAT RECENT LAB WORK REVEALED  ELEVATED D-DIMER AND OTHER ABNORMALITIES.  PATIENT DOES NOT TAKE ANY ANTIHYPERTENSIVE MEDICATION AT HOME AND DOES NOT CURRENTLY TAKE ANY BLOOD THINNERS.  ASPIRIN WAS ORDERED.  LAB WORK AND IMAGING WERE ORDERED.  PATIENT WAS STABLE.    Plan:  Please see initial orders and work-up below.  This is to be continued with full evaluation in the main Emergency Department.     aspirin chewable tablet 324 mg, 324 mg, Oral, Now       Results for orders placed or performed during the hospital encounter of 02/16/22 (from the past 24 hour(s))   CBC/DIFF    Narrative    The following orders were created for panel order CBC/DIFF.  Procedure                               Abnormality         Status                     ---------                               -----------         ------  CBC WITH UTML[[id number]]                                                                 Please view results for these tests on the individual orders.   URINALYSIS, MACROSCOPIC AND MICROSCOPIC W/CULTURE REFLEX    Specimen: Urine, Site not specified    Narrative    The following orders were created for panel order URINALYSIS, MACROSCOPIC AND MICROSCOPIC W/CULTURE REFLEX.  Procedure                               Abnormality         Status                     ---------                               -----------         ------                     URINALYSIS, MACROSCOPIC[[id number]]                                                     URINALYSIS, MICROSCOPIC[[id number]]                                                       Please view results for these tests on the individual orders.        Thera Flake, DO  02/16/2022, 18:08

## 2022-02-16 NOTE — ED Triage Notes (Signed)
States intermittent chest pain for over a year. States she had EKG done last Tuesday and said something was off with the EKG and she had appointment today at [facility] heart center but due to weather today it was cancelled.states pain worse today.

## 2022-02-17 DIAGNOSIS — R079 Chest pain, unspecified: Secondary | ICD-10-CM

## 2022-02-17 LAB — ECG 12 LEAD
Atrial Rate: 88 {beats}/min
Calculated P Axis: 68 degrees
Calculated R Axis: -20 degrees
Calculated T Axis: 35 degrees
PR Interval: 142 ms
QRS Duration: 94 ms
QT Interval: 386 ms
QTC Calculation: 467 ms
Ventricular rate: 88 {beats}/min

## 2023-04-19 ENCOUNTER — Other Ambulatory Visit (HOSPITAL_COMMUNITY): Payer: Self-pay | Admitting: FAMILY PRACTICE

## 2023-04-19 DIAGNOSIS — R590 Localized enlarged lymph nodes: Secondary | ICD-10-CM

## 2023-05-11 ENCOUNTER — Ambulatory Visit
Admission: RE | Admit: 2023-05-11 | Discharge: 2023-05-11 | Disposition: A | Discharge status: Home / Self Care | Payer: Self-pay | Source: Ambulatory Visit | Attending: FAMILY PRACTICE | Admitting: FAMILY PRACTICE

## 2023-05-11 ENCOUNTER — Other Ambulatory Visit: Payer: Self-pay

## 2023-05-11 DIAGNOSIS — R590 Localized enlarged lymph nodes: Secondary | ICD-10-CM | POA: Insufficient documentation

## 2023-07-14 ENCOUNTER — Encounter (INDEPENDENT_AMBULATORY_CARE_PROVIDER_SITE_OTHER): Payer: Self-pay | Admitting: Surgery

## 2023-07-16 NOTE — H&P (Deleted)
 GENERAL SURGERY, [facility]  [address]  [place] [address]    History and Physical    Name: Caroline Hooper MRN:  [id number]   Date: 07/18/2023 DOB:  [birth year] (58 y.o.)                  Reason for Visit: No chief complaint on file.    History of Present Illness  Ms. Leckey presents today***    Patient underwent an ultrasound of her neck showing a single benign-appearing lymph node 8 mm in maximum diameter.        MEDICAL DECISION:  Review of the result(s) of each unique test:  Patient underwent diagnostic testing ( ultrasound) prior to this dates visit.  I have personally reviewed the results and that serves as a component of the medical decision making for this encounter       Review of prior external note(s) from each unique source:  Patients referral to this office including a recent assessment by the referring provider.  This was reviewed by me for this unique office visit for the indication and intent of the referral as well as any pertinent medical or surgical history relevant to the patients independent evaluation by me today.        Patient Data  Patient History  Past Medical History:   Diagnosis Date    Anxiety     Depression     Diabetes mellitus, type 2     Esophageal reflux     Hypertension     Insomnia     Nephrolithiasis     Osteoarthritis     Peripheral neuropathy     RLS (restless legs syndrome)          Past Surgical History:   Procedure Laterality Date    HX CHOLECYSTECTOMY      HX TUBAL LIGATION           Current Outpatient Medications   Medication Sig    amitriptyline (ELAVIL) 50 mg Oral Tablet     aspirin  81 mg Oral Tablet, Chewable Chew 1 Tablet (81 mg total)    busPIRone (BUSPAR) 10 mg Oral Tablet TAKE 1 TABLET by mouth IN THE MORNING AND 3 TABLETS AT NIGHT    Colestipol (COLESTID) 1 gram Oral Tablet TAKE 2 TABLETS by mouth DAILY AS DIRECTED    diclofenac sodium (VOLTAREN) 75 mg Oral Tablet, Delayed Release (E.C.)     famotidine (PEPCID) 40 mg/5  mL (8 mg/mL) Oral Suspension for Reconstitution Take 5 mL (40 mg total) by mouth    folic acid (FOLVITE) 1 mg Oral Tablet Take 1 Tablet (1 mg total) by mouth Daily    furosemide (LASIX) 20 mg Oral Tablet TAKE 1 TABLET by mouth EVERY MORNING AS NEEDED for foot/leg swelling    isosorbide mononitrate (IMDUR) 30 mg Oral Tablet Sustained Release 24 hr Take 1 Tablet (30 mg total) by mouth Every night    LORazepam (ATIVAN) 0.5 mg Oral Tablet Take 1 Tablet (0.5 mg total) by mouth Once per day as needed    metFORMIN (GLUCOPHAGE) 500 mg Oral Tablet Take 1 Tablet (500 mg total) by mouth Daily    metoclopramide HCl (REGLAN) 5 mg Oral Tablet TAKE 1 TABLET TWICE DAILY BEFORE breakfast AND SUPPER    ondansetron (ZOFRAN ODT) 8 mg Oral Tablet, Rapid Dissolve Take 1 Tablet (8 mg total) by mouth Every 8 hours as needed    rOPINIRole (REQUIP) 2 mg Oral Tablet  semaglutide (OZEMPIC) 2 mg/dose (8 mg/3 mL) Subcutaneous Pen Injector     sertraline (ZOLOFT) 50 mg Oral Tablet     tiZANidine (ZANAFLEX) 4 mg Oral Tablet     topiramate (TOPAMAX) 25 mg Oral Tablet     traMADoL (ULTRAM) 50 mg Oral Tablet TAKE 1 TABLET by mouth THREE TIMES DAILY AS NEEDED FOR uncontrolled PAIN     Allergies[1]  Family Medical History:    None         Social History[2]         Physical Examination:  There were no vitals filed for this visit.   General: appropriate for age. in no acute distress.    Vital signs are present above and have been reviewed by me     HEENT: Atraumatic, Normocephalic.    Lungs: Nonlabored breathing with symmetric expansion    Heart:Regular wth respect to rate.    Abdomen:Soft. Nontender. Nondistended     Psychiatric: Alert and oriented to person, place, and time. affect appropriate      Assessment and Plan  No diagnosis found.      ***          I appreciate the opportunity to be involved in the care of your patients.  If you have any questions or concerns regarding this encounter, please do not hesitate to contact me at your  convenience.      Alica Inks MD MBA CPE FACS     This note may have been partially generated using MModal Fluency Direct system, and there may be some incorrect words, spellings, and punctuation that were not noted in checking the note before saving, though effort was made to avoid such errors.                 [1] No Known Allergies  [2]   Social History  Tobacco Use    Smoking status: Never    Smokeless tobacco: Never   Vaping Use    Vaping status: Never Used   Substance Use Topics    Alcohol use: Not Currently    Drug use: Not Currently

## 2023-07-18 ENCOUNTER — Ambulatory Visit (INDEPENDENT_AMBULATORY_CARE_PROVIDER_SITE_OTHER): Payer: Self-pay | Admitting: Surgery

## 2023-08-15 ENCOUNTER — Ambulatory Visit (INDEPENDENT_AMBULATORY_CARE_PROVIDER_SITE_OTHER): Payer: Self-pay | Admitting: Surgery

## 2023-08-22 ENCOUNTER — Ambulatory Visit (INDEPENDENT_AMBULATORY_CARE_PROVIDER_SITE_OTHER): Admitting: Surgery

## 2023-09-04 NOTE — H&P (Deleted)
 GENERAL SURGERY, [facility] GENER[facility]  [address]  [place] [address]    History and Physical    Name: Caroline Hooper MRN:  [id number]   Date: 09/05/2023 DOB:  1965-09-06 (58 y.o.)                  Reason for Visit: No chief complaint on file.    History of Present Illness  Caroline Hooper presents today***    Last radiographic study was performed in April showing "benign-appearing lymph node. "        MEDICAL DECISION:  Review of the result(s) of each unique test:  Patient underwent diagnostic testing ( ultrasound) prior to this dates visit.  I have personally reviewed the results and that serves as a component of the medical decision making for this encounter       Review of prior external note(s) from each unique source:  Patients referral to this office including a recent assessment by the referring provider.  This was reviewed by me for this unique office visit for the indication and intent of the referral as well as any pertinent medical or surgical history relevant to the patients independent evaluation by me today.        Patient Data  Patient History  Past Medical History:   Diagnosis Date    Anxiety     Depression     Diabetes mellitus, type 2     Esophageal reflux     Hypertension     Insomnia     Nephrolithiasis     Osteoarthritis     Peripheral neuropathy     RLS (restless legs syndrome)          Past Surgical History:   Procedure Laterality Date    HX CHOLECYSTECTOMY      HX TUBAL LIGATION           Current Outpatient Medications   Medication Sig    amitriptyline (ELAVIL) 50 mg Oral Tablet     aspirin  81 mg Oral Tablet, Chewable Chew 1 Tablet (81 mg total)    busPIRone (BUSPAR) 10 mg Oral Tablet TAKE 1 TABLET by mouth IN THE MORNING AND 3 TABLETS AT NIGHT    Colestipol (COLESTID) 1 gram Oral Tablet TAKE 2 TABLETS by mouth DAILY AS DIRECTED    diclofenac sodium (VOLTAREN) 75 mg Oral Tablet, Delayed Release (E.C.)     famotidine (PEPCID) 40 mg/5 mL (8 mg/mL) Oral Suspension  for Reconstitution Take 5 mL (40 mg total) by mouth    folic acid (FOLVITE) 1 mg Oral Tablet Take 1 Tablet (1 mg total) by mouth Daily    furosemide (LASIX) 20 mg Oral Tablet TAKE 1 TABLET by mouth EVERY MORNING AS NEEDED for foot/leg swelling    isosorbide mononitrate (IMDUR) 30 mg Oral Tablet Sustained Release 24 hr Take 1 Tablet (30 mg total) by mouth Every night    LORazepam (ATIVAN) 0.5 mg Oral Tablet Take 1 Tablet (0.5 mg total) by mouth Once per day as needed    metFORMIN (GLUCOPHAGE) 500 mg Oral Tablet Take 1 Tablet (500 mg total) by mouth Daily    metoclopramide HCl (REGLAN) 5 mg Oral Tablet TAKE 1 TABLET TWICE DAILY BEFORE breakfast AND SUPPER    ondansetron (ZOFRAN ODT) 8 mg Oral Tablet, Rapid Dissolve Take 1 Tablet (8 mg total) by mouth Every 8 hours as needed    rOPINIRole (REQUIP) 2 mg Oral Tablet     semaglutide (OZEMPIC) 2 mg/dose (  8 mg/3 mL) Subcutaneous Pen Injector     sertraline (ZOLOFT) 50 mg Oral Tablet     tiZANidine (ZANAFLEX) 4 mg Oral Tablet     topiramate (TOPAMAX) 25 mg Oral Tablet     traMADoL (ULTRAM) 50 mg Oral Tablet TAKE 1 TABLET by mouth THREE TIMES DAILY AS NEEDED FOR uncontrolled PAIN     Allergies[1]  Family Medical History:    None         Social History[2]         Physical Examination:  There were no vitals filed for this visit.   General: appropriate for age. in no acute distress.    Vital signs are present above and have been reviewed by me     HEENT: Atraumatic, Normocephalic.    Lungs: Nonlabored breathing with symmetric expansion    Heart:Regular wth respect to rate.    Abdomen:Soft. Nontender. Nondistended     Psychiatric: Alert and oriented to person, place, and time. affect appropriate      Assessment and Plan  No diagnosis found.      ***          I appreciate the opportunity to be involved in the care of your patients.  If you have any questions or concerns regarding this encounter, please do not hesitate to contact me at your convenience.      Alm DELENA Nam MD MBA  CPE FACS     This note may have been partially generated using MModal Fluency Direct system, and there may be some incorrect words, spellings, and punctuation that were not noted in checking the note before saving, though effort was made to avoid such errors.                 [1] No Known Allergies  [2]   Social History  Tobacco Use    Smoking status: Never    Smokeless tobacco: Never   Vaping Use    Vaping status: Never Used   Substance Use Topics    Alcohol use: Not Currently    Drug use: Not Currently

## 2023-09-05 ENCOUNTER — Ambulatory Visit (INDEPENDENT_AMBULATORY_CARE_PROVIDER_SITE_OTHER): Admitting: Surgery
